# Patient Record
Sex: Female | Born: 1970 | Race: White | Hispanic: No | Marital: Married
Health system: Midwestern US, Community
[De-identification: ages and names within clinical notes are randomized; demographics above are authoritative.]

## PROBLEM LIST (undated history)

## (undated) DIAGNOSIS — M1990 Unspecified osteoarthritis, unspecified site: Secondary | ICD-10-CM

## (undated) DIAGNOSIS — J449 Chronic obstructive pulmonary disease, unspecified: Secondary | ICD-10-CM

## (undated) DIAGNOSIS — R51 Headache: Secondary | ICD-10-CM

## (undated) DIAGNOSIS — F329 Major depressive disorder, single episode, unspecified: Secondary | ICD-10-CM

## (undated) DIAGNOSIS — J069 Acute upper respiratory infection, unspecified: Secondary | ICD-10-CM

## (undated) DIAGNOSIS — K219 Gastro-esophageal reflux disease without esophagitis: Secondary | ICD-10-CM

---
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
## 1996-10-26 HISTORY — PX: TUBAL LIGATION: SHX77

## 1997-10-26 DIAGNOSIS — F32A Depression, unspecified: Secondary | ICD-10-CM

## 1997-10-26 HISTORY — DX: Depression, unspecified: F32.A

## 1999-10-27 HISTORY — PX: ABDOMINAL HYSTERECTOMY: SHX81

## 2009-10-26 DIAGNOSIS — R51 Headache: Secondary | ICD-10-CM

## 2009-10-26 DIAGNOSIS — J449 Chronic obstructive pulmonary disease, unspecified: Secondary | ICD-10-CM

## 2009-10-26 HISTORY — DX: Chronic obstructive pulmonary disease, unspecified: J44.9

## 2009-10-26 HISTORY — DX: Headache: R51

## 2012-01-21 ENCOUNTER — Other Ambulatory Visit: Payer: Self-pay | Admitting: Neurosurgery

## 2012-02-04 ENCOUNTER — Inpatient Hospital Stay (HOSPITAL_COMMUNITY): Admission: RE | Admit: 2012-02-04 | Discharge: 2012-02-04 | Payer: Self-pay | Source: Ambulatory Visit

## 2012-02-04 NOTE — Pre-Procedure Instructions (Signed)
20 Sabena M Kaiser  02/04/2012   Your procedure is scheduled on:  Fri, April 19 @ 11:10 am   Report to [facility] at 0800 AM.  Call this number if you have problems the morning of surgery: [phone]   Remember:   Do not eat food:After Midnight.  May have clear liquids: up to 4 Hours before arrival.(until 4:00 am)  Clear liquids include soda, tea, black coffee, apple or grape juice, broth,water  Take these medicines the morning of surgery with A SIP OF WATER:    Do not wear jewelry, make-up or nail polish.  Do not wear lotions, powders, or perfumes.   Do not shave 48 hours prior to surgery.  Do not bring valuables to the hospital.  Contacts, dentures or bridgework may not be worn into surgery.  Leave suitcase in the car. After surgery it may be brought to your room.  For patients admitted to the hospital, checkout time is 11:00 AM the day of discharge.   Special Instructions: CHG Shower Use Special Wash: 1/2 bottle night before surgery and 1/2 bottle morning of surgery.   Please read over the following fact sheets that you were given: Pain Booklet, Coughing and Deep Breathing, MRSA Information and Surgical Site Infection Prevention

## 2012-02-12 ENCOUNTER — Ambulatory Visit (HOSPITAL_COMMUNITY): Admission: RE | Admit: 2012-02-12 | Payer: Medicare Other | Source: Ambulatory Visit | Admitting: Neurosurgery

## 2012-02-12 ENCOUNTER — Encounter (HOSPITAL_COMMUNITY): Admission: RE | Payer: Self-pay | Source: Ambulatory Visit

## 2012-02-12 SURGERY — ANTERIOR CERVICAL DECOMPRESSION/DISCECTOMY FUSION 2 LEVELS
Anesthesia: General

## 2012-03-01 ENCOUNTER — Other Ambulatory Visit: Payer: Self-pay | Admitting: Neurosurgery

## 2012-03-24 ENCOUNTER — Encounter (HOSPITAL_COMMUNITY): Payer: Self-pay

## 2012-03-24 ENCOUNTER — Inpatient Hospital Stay (HOSPITAL_COMMUNITY): Admission: RE | Admit: 2012-03-24 | Discharge: 2012-03-24 | Payer: Medicare Other | Source: Ambulatory Visit

## 2012-03-24 HISTORY — DX: Major depressive disorder, single episode, unspecified: F32.9

## 2012-03-24 HISTORY — DX: Gastro-esophageal reflux disease without esophagitis: K21.9

## 2012-03-24 HISTORY — DX: Headache: R51

## 2012-03-24 HISTORY — DX: Chronic obstructive pulmonary disease, unspecified: J44.9

## 2012-03-24 HISTORY — DX: Unspecified osteoarthritis, unspecified site: M19.90

## 2012-03-24 NOTE — Progress Notes (Signed)
Pt called to obtain PAT assessment. Denies sleep apnea-stated she was tested over 10years ago & it was negative.  Denies any cardiac studies.  She reports having congestion at this time and plans on seeing PCP: Dr. Vanetta Shawl w/[facility](#[phone]) tomorrow.  Encouraged pt to follow through especially due to having surgery in over 1 week and maybe being able to treat prior to surgery.  Pt verbalized understanding.  Confirmed w/ pt date and time of lab appt.//L. Ayaka Andes,RN

## 2012-03-24 NOTE — Progress Notes (Signed)
Noted that orders written on 03/01/2012- will be out of date by time of surgery on 04/08/2012.  Attempted to notify Jessica Hanks at Dr. Andre Lefort was out to lunch.  Message left w/ receptionist, Kaysha.//L. Giorgi Debruin,RN

## 2012-03-25 ENCOUNTER — Encounter (HOSPITAL_COMMUNITY)
Admission: RE | Admit: 2012-03-25 | Discharge: 2012-03-25 | Disposition: A | Discharge status: Home / Self Care | Payer: Medicare Other | Source: Ambulatory Visit | Attending: Neurosurgery | Admitting: Neurosurgery

## 2012-03-25 ENCOUNTER — Encounter (HOSPITAL_COMMUNITY): Payer: Self-pay

## 2012-03-25 ENCOUNTER — Encounter (HOSPITAL_COMMUNITY): Payer: Self-pay | Admitting: Pharmacy Technician

## 2012-03-25 HISTORY — DX: Acute upper respiratory infection, unspecified: J06.9

## 2012-03-25 LAB — CBC
HCT: 44.6 % (ref 36.0–46.0)
Hemoglobin: 15 g/dL (ref 12.0–15.0)
WBC: 12.4 10*3/uL — ABNORMAL HIGH (ref 4.0–10.5)

## 2012-03-25 LAB — SURGICAL PCR SCREEN: MRSA, PCR: NEGATIVE

## 2012-03-25 NOTE — Pre-Procedure Instructions (Addendum)
20 Alicia Yang   03/25/2012   Your procedure is scheduled on:  June 14th, Friday  Report to [facility] at 800 AM.   Call this number if you have problems the morning of surgery: [phone]   Remember:   Do not eat food:After Midnight Thursday.  May have clear liquids: up to 4 Hours before arrival time-400 AM.  Clear liquids include soda, tea, black coffee, apple or grape juice, broth.   Take these medicines the morning of surgery with A SIP OF WATER:  Albuterol,              Xanax, Wellbutrin, Celexa, Percocet, Zantac,nexium, singulair                           Do NOT take any aspirin, anti-inflammatories, coumadin, plavix or herbal             Medications/ supplements x 4-5 days prior to   Do not wear jewelry, make-up or nail polish.  Do not wear lotions, powders, or perfumes. You may wear deodorant.  Do not shave 48 hours prior to surgery. Men may shave face and neck.   Do not bring valuables to the hospital.   Contacts, dentures or bridgework may not be worn into surgery.  Leave suitcase in the car. After surgery it may be brought to your room.  For patients admitted to the hospital, checkout time is 11:00 AM the day of discharge.   Patients discharged the day of surgery will not be allowed to drive home.  Name and phone number of your driver: brittany [phone]  Special Instructions: CHG Shower Use Special Wash: 1/2 bottle night before surgery and 1/2 bottle morning of surgery.   Please read over the following fact sheets that you were given: Pain Booklet, Coughing and Deep Breathing, MRSA Information and Surgical Site Infection Prevention

## 2012-03-28 ENCOUNTER — Other Ambulatory Visit: Payer: Self-pay | Admitting: Neurosurgery

## 2012-03-28 NOTE — Consult Note (Signed)
Anesthesia Chart Review:  Patient is a 41 year old female scheduled for C4-5, C5-6 ACDF with fusion on 04/08/12. History includes smoking, COPD, asthma, GERD, arthritis, headaches, depression, obesity with BMI 37.56, and abdominal hysterectomy.   PCP is documented as Dr. Meredith Mody [phone]) with [facility].  Labs noted.  "No worrisome focal or acute cardiopulmonary abnormalities seen" on CXR from 03/25/12.  EKG from 03/25/12 showed NSR, poor anterior r wave progression--cannot rule out anterior infarct (age undetermined).  Currently there are no comparison EKGs available (within [facility] or at PCP office).  No CV symptoms were documented at her PAT appointment.  She has no known history of HTN, CAD/MI/CHF, or DM.  If remains asymptomatic, then anticipate she can proceed as planned.  Shonna Chock, PA-C

## 2012-04-07 MED ORDER — CEFAZOLIN SODIUM-DEXTROSE 2-3 GM-% IV SOLR: 2.0000 g | INTRAVENOUS | Status: DC

## 2012-04-08 ENCOUNTER — Ambulatory Visit (HOSPITAL_COMMUNITY): Payer: Medicare Other

## 2012-04-08 ENCOUNTER — Ambulatory Visit (HOSPITAL_COMMUNITY): Payer: Medicare Other | Admitting: Vascular Surgery

## 2012-04-08 ENCOUNTER — Inpatient Hospital Stay (HOSPITAL_COMMUNITY)
Admission: RE | Admit: 2012-04-08 | Discharge: 2012-04-09 | DRG: 473 | Disposition: A | Discharge status: Home / Self Care | Payer: Medicare Other | Source: Ambulatory Visit | Attending: Neurosurgery | Admitting: Neurosurgery

## 2012-04-08 ENCOUNTER — Encounter (HOSPITAL_COMMUNITY): Payer: Self-pay

## 2012-04-08 ENCOUNTER — Encounter (HOSPITAL_COMMUNITY): Payer: Self-pay | Admitting: Vascular Surgery

## 2012-04-08 ENCOUNTER — Encounter (HOSPITAL_COMMUNITY)
Admission: RE | Disposition: A | Discharge status: Home / Self Care | Payer: Self-pay | Source: Ambulatory Visit | Attending: Neurosurgery

## 2012-04-08 DIAGNOSIS — Q762 Congenital spondylolisthesis: Secondary | ICD-10-CM

## 2012-04-08 DIAGNOSIS — F411 Generalized anxiety disorder: Secondary | ICD-10-CM | POA: Diagnosis present

## 2012-04-08 DIAGNOSIS — K219 Gastro-esophageal reflux disease without esophagitis: Secondary | ICD-10-CM | POA: Diagnosis present

## 2012-04-08 DIAGNOSIS — Z6832 Body mass index (BMI) 32.0-32.9, adult: Secondary | ICD-10-CM

## 2012-04-08 DIAGNOSIS — F329 Major depressive disorder, single episode, unspecified: Secondary | ICD-10-CM | POA: Diagnosis present

## 2012-04-08 DIAGNOSIS — F4310 Post-traumatic stress disorder, unspecified: Secondary | ICD-10-CM | POA: Diagnosis present

## 2012-04-08 DIAGNOSIS — Z79899 Other long term (current) drug therapy: Secondary | ICD-10-CM

## 2012-04-08 DIAGNOSIS — M47812 Spondylosis without myelopathy or radiculopathy, cervical region: Secondary | ICD-10-CM | POA: Diagnosis present

## 2012-04-08 DIAGNOSIS — J449 Chronic obstructive pulmonary disease, unspecified: Secondary | ICD-10-CM | POA: Diagnosis present

## 2012-04-08 DIAGNOSIS — J4489 Other specified chronic obstructive pulmonary disease: Secondary | ICD-10-CM | POA: Diagnosis present

## 2012-04-08 DIAGNOSIS — F1210 Cannabis abuse, uncomplicated: Secondary | ICD-10-CM | POA: Diagnosis present

## 2012-04-08 DIAGNOSIS — F3289 Other specified depressive episodes: Secondary | ICD-10-CM | POA: Diagnosis present

## 2012-04-08 DIAGNOSIS — F17200 Nicotine dependence, unspecified, uncomplicated: Secondary | ICD-10-CM | POA: Diagnosis present

## 2012-04-08 DIAGNOSIS — Z833 Family history of diabetes mellitus: Secondary | ICD-10-CM

## 2012-04-08 DIAGNOSIS — M5020 Other cervical disc displacement, unspecified cervical region: Principal | ICD-10-CM | POA: Diagnosis present

## 2012-04-08 HISTORY — PX: ANTERIOR CERVICAL DECOMP/DISCECTOMY FUSION: SHX1161

## 2012-04-08 SURGERY — ANTERIOR CERVICAL DECOMPRESSION/DISCECTOMY FUSION 2 LEVELS
Anesthesia: General | Site: Neck | Laterality: Bilateral | Wound class: Clean

## 2012-04-08 MED ORDER — MORPHINE SULFATE 2 MG/ML IJ SOLN
1.0000 mg | INTRAMUSCULAR | Status: DC | PRN
  Administered 2012-04-08 (×2): 2 mg via INTRAVENOUS
  Filled 2012-04-08: qty 2

## 2012-04-08 MED ORDER — HYDROMORPHONE HCL PF 1 MG/ML IJ SOLN
INTRAMUSCULAR | Status: AC
  Filled 2012-04-08: qty 1

## 2012-04-08 MED ORDER — FAMOTIDINE 10 MG PO TABS
10.0000 mg | ORAL_TABLET | Freq: Every day | ORAL | Status: DC
  Filled 2012-04-08: qty 1

## 2012-04-08 MED ORDER — MENTHOL 3 MG MT LOZG
1.0000 | LOZENGE | OROMUCOSAL | Status: DC | PRN
  Filled 2012-04-08: qty 9

## 2012-04-08 MED ORDER — BUPROPION HCL 100 MG PO TABS
100.0000 mg | ORAL_TABLET | Freq: Two times a day (BID) | ORAL | Status: DC
  Administered 2012-04-08: 100 mg via ORAL
  Filled 2012-04-08 (×3): qty 1

## 2012-04-08 MED ORDER — LACTATED RINGERS IV SOLN
INTRAVENOUS | Status: DC | PRN
  Administered 2012-04-08 (×2): via INTRAVENOUS

## 2012-04-08 MED ORDER — SODIUM CHLORIDE 0.9 % IJ SOLN
3.0000 mL | Freq: Two times a day (BID) | INTRAMUSCULAR | Status: DC
  Administered 2012-04-08: 3 mL via INTRAVENOUS

## 2012-04-08 MED ORDER — EPHEDRINE SULFATE 50 MG/ML IJ SOLN
INTRAMUSCULAR | Status: DC | PRN
  Administered 2012-04-08: 10 mg via INTRAVENOUS

## 2012-04-08 MED ORDER — PANTOPRAZOLE SODIUM 40 MG PO TBEC: 40.0000 mg | DELAYED_RELEASE_TABLET | Freq: Every day | ORAL | Status: DC

## 2012-04-08 MED ORDER — HEMOSTATIC AGENTS (NO CHARGE) OPTIME
TOPICAL | Status: DC | PRN
  Administered 2012-04-08: 1 via TOPICAL

## 2012-04-08 MED ORDER — LIDOCAINE HCL (CARDIAC) 20 MG/ML IV SOLN
INTRAVENOUS | Status: DC | PRN
  Administered 2012-04-08: 40 mg via INTRAVENOUS
  Administered 2012-04-08: 100 mg via INTRAVENOUS

## 2012-04-08 MED ORDER — ACETAMINOPHEN 650 MG RE SUPP: 650.0000 mg | RECTAL | Status: DC | PRN

## 2012-04-08 MED ORDER — CALCIUM-VITAMIN D 500-200 MG-UNIT PO TABS: 1.0000 | ORAL_TABLET | Freq: Two times a day (BID) | ORAL | Status: DC

## 2012-04-08 MED ORDER — ONDANSETRON HCL 4 MG/2ML IJ SOLN: 4.0000 mg | Freq: Once | INTRAMUSCULAR | Status: DC | PRN

## 2012-04-08 MED ORDER — ONDANSETRON HCL 4 MG/2ML IJ SOLN
4.0000 mg | INTRAMUSCULAR | Status: DC | PRN
  Administered 2012-04-08: 4 mg via INTRAVENOUS
  Filled 2012-04-08: qty 2

## 2012-04-08 MED ORDER — HYDROCODONE-ACETAMINOPHEN 5-325 MG PO TABS: 1.0000 | ORAL_TABLET | ORAL | Status: DC | PRN

## 2012-04-08 MED ORDER — ALBUTEROL SULFATE HFA 108 (90 BASE) MCG/ACT IN AERS
INHALATION_SPRAY | RESPIRATORY_TRACT | Status: DC | PRN
  Administered 2012-04-08: 2 via RESPIRATORY_TRACT

## 2012-04-08 MED ORDER — CEFAZOLIN SODIUM 1-5 GM-% IV SOLN
1.0000 g | Freq: Three times a day (TID) | INTRAVENOUS | Status: AC
  Administered 2012-04-08 – 2012-04-09 (×2): 1 g via INTRAVENOUS
  Filled 2012-04-08 (×2): qty 50

## 2012-04-08 MED ORDER — ALPRAZOLAM 0.5 MG PO TABS
1.0000 mg | ORAL_TABLET | Freq: Three times a day (TID) | ORAL | Status: DC | PRN
  Administered 2012-04-08: 1 mg via ORAL
  Filled 2012-04-08: qty 2

## 2012-04-08 MED ORDER — THROMBIN 5000 UNITS EX KIT
PACK | CUTANEOUS | Status: DC | PRN
  Administered 2012-04-08 (×2): 5000 [IU] via TOPICAL

## 2012-04-08 MED ORDER — OXYCODONE-ACETAMINOPHEN 5-325 MG PO TABS
ORAL_TABLET | ORAL | Status: AC
  Filled 2012-04-08: qty 2

## 2012-04-08 MED ORDER — CARISOPRODOL 350 MG PO TABS: 350.0000 mg | ORAL_TABLET | Freq: Three times a day (TID) | ORAL | Status: DC | PRN

## 2012-04-08 MED ORDER — 0.9 % SODIUM CHLORIDE (POUR BTL) OPTIME
TOPICAL | Status: DC | PRN
  Administered 2012-04-08: 1000 mL

## 2012-04-08 MED ORDER — ONDANSETRON HCL 4 MG/2ML IJ SOLN
INTRAMUSCULAR | Status: DC | PRN
  Administered 2012-04-08: 4 mg via INTRAVENOUS

## 2012-04-08 MED ORDER — GLYCOPYRROLATE 0.2 MG/ML IJ SOLN
INTRAMUSCULAR | Status: DC | PRN
  Administered 2012-04-08: .7 mg via INTRAVENOUS

## 2012-04-08 MED ORDER — SODIUM CHLORIDE 0.9 % IV SOLN
INTRAVENOUS | Status: AC
  Filled 2012-04-08: qty 500

## 2012-04-08 MED ORDER — ALBUTEROL SULFATE HFA 108 (90 BASE) MCG/ACT IN AERS
2.0000 | INHALATION_SPRAY | Freq: Four times a day (QID) | RESPIRATORY_TRACT | Status: DC | PRN
  Filled 2012-04-08: qty 6.7

## 2012-04-08 MED ORDER — FENTANYL CITRATE 0.05 MG/ML IJ SOLN
INTRAMUSCULAR | Status: DC | PRN
  Administered 2012-04-08: 50 ug via INTRAVENOUS
  Administered 2012-04-08 (×2): 100 ug via INTRAVENOUS
  Administered 2012-04-08 (×5): 50 ug via INTRAVENOUS

## 2012-04-08 MED ORDER — NEOSTIGMINE METHYLSULFATE 1 MG/ML IJ SOLN
INTRAMUSCULAR | Status: DC | PRN
  Administered 2012-04-08: 5 mg via INTRAVENOUS

## 2012-04-08 MED ORDER — CITALOPRAM HYDROBROMIDE 40 MG PO TABS
40.0000 mg | ORAL_TABLET | Freq: Every day | ORAL | Status: DC
  Filled 2012-04-08: qty 1

## 2012-04-08 MED ORDER — SODIUM CHLORIDE 0.9 % IJ SOLN: 3.0000 mL | INTRAMUSCULAR | Status: DC | PRN

## 2012-04-08 MED ORDER — LIDOCAINE-EPINEPHRINE 1 %-1:100000 IJ SOLN
INTRAMUSCULAR | Status: DC | PRN
  Administered 2012-04-08: 2.5 mL

## 2012-04-08 MED ORDER — HYDROMORPHONE HCL PF 1 MG/ML IJ SOLN: 0.5000 mg | INTRAMUSCULAR | Status: DC | PRN

## 2012-04-08 MED ORDER — DOCUSATE SODIUM 100 MG PO CAPS
100.0000 mg | ORAL_CAPSULE | Freq: Two times a day (BID) | ORAL | Status: DC
  Administered 2012-04-08: 100 mg via ORAL
  Filled 2012-04-08: qty 1

## 2012-04-08 MED ORDER — DEXAMETHASONE SODIUM PHOSPHATE 4 MG/ML IJ SOLN
INTRAMUSCULAR | Status: DC | PRN
  Administered 2012-04-08: 10 mg via INTRAVENOUS

## 2012-04-08 MED ORDER — KCL IN DEXTROSE-NACL 20-5-0.45 MEQ/L-%-% IV SOLN
INTRAVENOUS | Status: DC
  Administered 2012-04-08: 20:00:00 via INTRAVENOUS
  Filled 2012-04-08 (×3): qty 1000

## 2012-04-08 MED ORDER — OXYCODONE-ACETAMINOPHEN 10-325 MG PO TABS: 1.0000 | ORAL_TABLET | ORAL | Status: DC | PRN

## 2012-04-08 MED ORDER — CALCIUM CARBONATE-VITAMIN D 500-200 MG-UNIT PO TABS
1.0000 | ORAL_TABLET | Freq: Two times a day (BID) | ORAL | Status: DC
  Administered 2012-04-09: 1 via ORAL
  Filled 2012-04-08 (×3): qty 1

## 2012-04-08 MED ORDER — DIAZEPAM 5 MG PO TABS
ORAL_TABLET | ORAL | Status: AC
  Filled 2012-04-08: qty 1

## 2012-04-08 MED ORDER — ZOLPIDEM TARTRATE 5 MG PO TABS: 10.0000 mg | ORAL_TABLET | Freq: Every evening | ORAL | Status: DC | PRN

## 2012-04-08 MED ORDER — HYDROMORPHONE HCL PF 1 MG/ML IJ SOLN
0.2500 mg | INTRAMUSCULAR | Status: DC | PRN
  Administered 2012-04-08 (×6): 0.5 mg via INTRAVENOUS

## 2012-04-08 MED ORDER — ACETAMINOPHEN 325 MG PO TABS: 650.0000 mg | ORAL_TABLET | ORAL | Status: DC | PRN

## 2012-04-08 MED ORDER — PROPOFOL 10 MG/ML IV BOLUS
INTRAVENOUS | Status: DC | PRN
  Administered 2012-04-08: 200 mg via INTRAVENOUS

## 2012-04-08 MED ORDER — OXYCODONE-ACETAMINOPHEN 5-325 MG PO TABS
1.0000 | ORAL_TABLET | ORAL | Status: DC | PRN
  Administered 2012-04-08 – 2012-04-09 (×3): 2 via ORAL
  Filled 2012-04-08 (×3): qty 2

## 2012-04-08 MED ORDER — SODIUM CHLORIDE 0.9 % IR SOLN
Status: DC | PRN
  Administered 2012-04-08: 14:00:00

## 2012-04-08 MED ORDER — VITAMIN B-12 100 MCG PO TABS: 50.0000 ug | ORAL_TABLET | ORAL | Status: DC

## 2012-04-08 MED ORDER — BACITRACIN 50000 UNITS IM SOLR
INTRAMUSCULAR | Status: AC
  Filled 2012-04-08: qty 1

## 2012-04-08 MED ORDER — DIAZEPAM 5 MG PO TABS
5.0000 mg | ORAL_TABLET | Freq: Four times a day (QID) | ORAL | Status: DC | PRN
  Administered 2012-04-08 – 2012-04-09 (×3): 5 mg via ORAL
  Filled 2012-04-08 (×2): qty 1

## 2012-04-08 MED ORDER — PHENOL 1.4 % MT LIQD
1.0000 | OROMUCOSAL | Status: DC | PRN
  Administered 2012-04-09: 1 via OROMUCOSAL

## 2012-04-08 MED ORDER — BUPIVACAINE HCL (PF) 0.5 % IJ SOLN
INTRAMUSCULAR | Status: DC | PRN
  Administered 2012-04-08: 2.5 mL

## 2012-04-08 MED ORDER — MONTELUKAST SODIUM 10 MG PO TABS
10.0000 mg | ORAL_TABLET | Freq: Every day | ORAL | Status: DC
  Filled 2012-04-08: qty 1

## 2012-04-08 MED ORDER — OXYCODONE-ACETAMINOPHEN 5-325 MG PO TABS
1.0000 | ORAL_TABLET | ORAL | Status: DC | PRN
  Administered 2012-04-08: 2 via ORAL

## 2012-04-08 MED ORDER — CEFAZOLIN SODIUM-DEXTROSE 2-3 GM-% IV SOLR
INTRAVENOUS | Status: AC
  Administered 2012-04-08: 2 g via INTRAVENOUS
  Filled 2012-04-08: qty 50

## 2012-04-08 MED ORDER — ROCURONIUM BROMIDE 100 MG/10ML IV SOLN
INTRAVENOUS | Status: DC | PRN
  Administered 2012-04-08: 50 mg via INTRAVENOUS

## 2012-04-08 MED ORDER — MIDAZOLAM HCL 5 MG/5ML IJ SOLN
INTRAMUSCULAR | Status: DC | PRN
  Administered 2012-04-08: 2 mg via INTRAVENOUS

## 2012-04-08 SURGICAL SUPPLY — 66 items
BAG DECANTER FOR FLEXI CONT (MISCELLANEOUS) ×2 IMPLANT
BANDAGE GAUZE ELAST BULKY 4 IN (GAUZE/BANDAGES/DRESSINGS) IMPLANT
BENZOIN TINCTURE PRP APPL 2/3 (GAUZE/BANDAGES/DRESSINGS) IMPLANT
BIT DRILL 2.3 12 FIXED (INSTRUMENTS) ×1 IMPLANT
BIT DRILL NEURO 2X3.1 SFT TUCH (MISCELLANEOUS) ×1 IMPLANT
BLADE ULTRA TIP 2M (BLADE) IMPLANT
BONE CERV LORDOTIC 14.5X12X6 (Bone Implant) ×4 IMPLANT
BUR BARREL STRAIGHT FLUTE 4.0 (BURR) ×2 IMPLANT
CANISTER SUCTION 2500CC (MISCELLANEOUS) ×2 IMPLANT
CLOTH BEACON ORANGE TIMEOUT ST (SAFETY) ×2 IMPLANT
CONT SPEC 4OZ CLIKSEAL STRL BL (MISCELLANEOUS) ×2 IMPLANT
COVER MAYO STAND STRL (DRAPES) ×2 IMPLANT
DERMABOND ADVANCED (GAUZE/BANDAGES/DRESSINGS) ×1
DERMABOND ADVANCED .7 DNX12 (GAUZE/BANDAGES/DRESSINGS) ×1 IMPLANT
DRAPE LAPAROTOMY 100X72 PEDS (DRAPES) ×2 IMPLANT
DRAPE MICROSCOPE LEICA (MISCELLANEOUS) IMPLANT
DRAPE POUCH INSTRU U-SHP 10X18 (DRAPES) ×2 IMPLANT
DRAPE PROXIMA HALF (DRAPES) IMPLANT
DRESSING TELFA 8X3 (GAUZE/BANDAGES/DRESSINGS) IMPLANT
DRILL 12MM (INSTRUMENTS) ×2
DRILL NEURO 2X3.1 SOFT TOUCH (MISCELLANEOUS) ×2
DURAPREP 6ML APPLICATOR 50/CS (WOUND CARE) ×2 IMPLANT
ELECT COATED BLADE 2.86 ST (ELECTRODE) ×2 IMPLANT
ELECT REM PT RETURN 9FT ADLT (ELECTROSURGICAL) ×2
ELECTRODE REM PT RTRN 9FT ADLT (ELECTROSURGICAL) ×1 IMPLANT
GAUZE SPONGE 4X4 16PLY XRAY LF (GAUZE/BANDAGES/DRESSINGS) IMPLANT
GLOVE BIO SURGEON STRL SZ8 (GLOVE) IMPLANT
GLOVE BIOGEL PI IND STRL 8 (GLOVE) ×1 IMPLANT
GLOVE BIOGEL PI IND STRL 8.5 (GLOVE) ×2 IMPLANT
GLOVE BIOGEL PI INDICATOR 8 (GLOVE) ×1
GLOVE BIOGEL PI INDICATOR 8.5 (GLOVE) ×2
GLOVE ECLIPSE 8.0 STRL XLNG CF (GLOVE) IMPLANT
GLOVE EXAM NITRILE LRG STRL (GLOVE) IMPLANT
GLOVE EXAM NITRILE MD LF STRL (GLOVE) IMPLANT
GLOVE EXAM NITRILE XL STR (GLOVE) IMPLANT
GLOVE EXAM NITRILE XS STR PU (GLOVE) IMPLANT
GLOVE SURG SS PI 8.0 STRL IVOR (GLOVE) ×8 IMPLANT
GOWN BRE IMP SLV AUR LG STRL (GOWN DISPOSABLE) IMPLANT
GOWN BRE IMP SLV AUR XL STRL (GOWN DISPOSABLE) ×6 IMPLANT
GOWN STRL REIN 2XL LVL4 (GOWN DISPOSABLE) ×4 IMPLANT
HEAD HALTER (SOFTGOODS) ×2 IMPLANT
KIT BASIN OR (CUSTOM PROCEDURE TRAY) ×2 IMPLANT
KIT ROOM TURNOVER OR (KITS) ×2 IMPLANT
NEEDLE HYPO 18GX1.5 BLUNT FILL (NEEDLE) ×2 IMPLANT
NEEDLE HYPO 25X1 1.5 SAFETY (NEEDLE) ×2 IMPLANT
NEEDLE SPNL 22GX3.5 QUINCKE BK (NEEDLE) ×4 IMPLANT
NS IRRIG 1000ML POUR BTL (IV SOLUTION) ×2 IMPLANT
PACK LAMINECTOMY NEURO (CUSTOM PROCEDURE TRAY) ×2 IMPLANT
PAD ARMBOARD 7.5X6 YLW CONV (MISCELLANEOUS) ×2 IMPLANT
PIN DISTRACTION 14MM (PIN) ×4 IMPLANT
PLATE 30MM (Plate) ×2 IMPLANT
RUBBERBAND STERILE (MISCELLANEOUS) IMPLANT
SCREW 12MM (Screw) ×6 IMPLANT
SCREW BN 12X4.5XST VA (Screw) ×6 IMPLANT
SPONGE GAUZE 4X4 12PLY (GAUZE/BANDAGES/DRESSINGS) IMPLANT
SPONGE INTESTINAL PEANUT (DISPOSABLE) ×4 IMPLANT
SPONGE SURGIFOAM ABS GEL SZ50 (HEMOSTASIS) IMPLANT
STAPLER SKIN PROX WIDE 3.9 (STAPLE) IMPLANT
STRIP CLOSURE SKIN 1/2X4 (GAUZE/BANDAGES/DRESSINGS) IMPLANT
SUT VIC AB 3-0 SH 8-18 (SUTURE) ×2 IMPLANT
SYR 20ML ECCENTRIC (SYRINGE) ×2 IMPLANT
SYR 3ML LL SCALE MARK (SYRINGE) ×2 IMPLANT
TOWEL OR 17X24 6PK STRL BLUE (TOWEL DISPOSABLE) ×2 IMPLANT
TOWEL OR 17X26 10 PK STRL BLUE (TOWEL DISPOSABLE) ×2 IMPLANT
TRAP SPECIMEN MUCOUS 40CC (MISCELLANEOUS) IMPLANT
WATER STERILE IRR 1000ML POUR (IV SOLUTION) ×2 IMPLANT

## 2012-04-08 NOTE — Plan of Care (Signed)
Problem: Consults Goal: Diagnosis - Spinal Surgery Outcome: Completed/Met Date Met:  04/08/12 Cervical Spine Fusion     

## 2012-04-08 NOTE — Preoperative (Signed)
Beta Blockers   Reason not to administer Beta Blockers:Not Applicable 

## 2012-04-08 NOTE — Anesthesia Procedure Notes (Signed)
Procedure Name: Intubation Date/Time: 04/08/2012 1:53 PM Performed by: Rogelia Boga Pre-anesthesia Checklist: Patient identified, Emergency Drugs available, Suction available, Patient being monitored and Timeout performed Patient Re-evaluated:Patient Re-evaluated prior to inductionOxygen Delivery Method: Circle system utilized Preoxygenation: Pre-oxygenation with 100% oxygen Intubation Type: IV induction Ventilation: Mask ventilation without difficulty and Oral airway inserted - appropriate to patient size Laryngoscope Size: Mac and 4 Grade View: Grade II Tube type: Oral Tube size: 7.5 mm Number of attempts: 1 Airway Equipment and Method: Stylet Placement Confirmation: ETT inserted through vocal cords under direct vision,  positive ETCO2 and breath sounds checked- equal and bilateral Secured at: 20 cm Tube secured with: Tape Dental Injury: Teeth and Oropharynx as per pre-operative assessment

## 2012-04-08 NOTE — Interval H&P Note (Signed)
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 After consideration of risks, benefits and other options for treatment, the patient has consented to  Procedure(s) (LRB): ANTERIOR CERVICAL DECOMPRESSION/DISCECTOMY FUSION 2 LEVELS (N/A) as a surgical intervention .  The patients' history has been reviewed, patient examined, no change in status, stable for surgery.  I have reviewed the patients' chart and labs.  Questions were answered to the patient's satisfaction.     Alicia Yang  Date of Initial H&P: 03/09/2012  History reviewed, patient examined, no change in status, stable for surgery.

## 2012-04-08 NOTE — Progress Notes (Signed)
Patient awake, alert, conversant.  MAEW with full bilateral upper extremity strength in D/B/T/HI.  Doing well.

## 2012-04-08 NOTE — Anesthesia Postprocedure Evaluation (Signed)
  Anesthesia Post-op Note  Patient: Alicia Yang  Procedure(s) Performed: Procedure(s) (LRB): ANTERIOR CERVICAL DECOMPRESSION/DISCECTOMY FUSION 2 LEVELS (Bilateral)  Patient Location: PACU  Anesthesia Type: General  Level of Consciousness: awake, alert  and oriented  Airway and Oxygen Therapy: Patient Spontanous Breathing and Patient connected to nasal cannula oxygen  Post-op Pain: moderate  Post-op Assessment: Post-op Vital signs reviewed  Post-op Vital Signs: Reviewed  Complications: No apparent anesthesia complications

## 2012-04-08 NOTE — Op Note (Signed)
04/08/2012  4:31 PM  PATIENT:  Alicia Yang  41 y.o. female  PRE-OPERATIVE DIAGNOSIS:  Cervical herniated nucleus pulposus without myelopathy, Cervical spondylosis, Cervical stenosis, Cervical radiculopathy C45 and C56  POST-OPERATIVE DIAGNOSIS:  Cervical herniated nucleus pulposus without myelopathy, Cervical spondylosis, Cervical stenosis, Cervical radiculopathy C45 and C56  PROCEDURE:  Procedure(s) (LRB): ANTERIOR CERVICAL DECOMPRESSION/DISCECTOMY FUSION 2 LEVELS with Autograft, allograft, plate(Bilateral)  SURGEON:  Surgeon(s) and Role:    * Maeola Harman, MD - Primary    * Mariam Dollar, MD - Assisting  PHYSICIAN ASSISTANT:   ASSISTANTS: Poteat, RN   ANESTHESIA:   general  EBL:  Total I/O In: 2000 [I.V.:2000] Out: -   BLOOD ADMINISTERED:none  DRAINS: none   LOCAL MEDICATIONS USED:  LIDOCAINE   SPECIMEN:  No Specimen  DISPOSITION OF SPECIMEN:  N/A  COUNTS:  YES  TOURNIQUET:  * No tourniquets in log *  DICTATION: Patient is 41 year old female with left arm pain and weakness with HNP, spondylosis, radiculopathy C45 and C56  PROCEDURE: Patient was brought to operating room and following the smooth and uncomplicated induction of general endotracheal anesthesia her head was placed on a horseshoe head holder she was placed in 5 pounds of Holter traction and her anterior neck was prepped and draped in usual sterile fashion. An incision was made on the left side of midline after infiltrating the skin and subcutaneous tissues with local lidocaine. The platysmal layer was incised and subplatysmal dissection was performed exposing the anterior border sternocleidomastoid muscle. Using blunt dissection the carotid sheath was kept lateral and trachea and esophagus kept medial exposing the anterior cervical spine. A bent spinal needle was placed it was felt to be the C45 and C56 levels and this was confirmed on a second intraoperative x-ray. Longus coli muscles were taken down from the  anterior cervical spine using electrocautery and key elevator and self-retaining retractor was placed exposing the C45 and C56 levels. The interspaces were incised and a thorough discectomy was performed. Distraction pins were placed. Initially the C45 level was operated. Uncinate spurs and central spondylitic ridges were drilled down with a high-speed drill. The spinal cord dura and both C5 nerve roots were widely decompressed. Hemostasis was assured. After trial sizing an 6x4 mm allograft bone wedge was selected and packed with local autograft. This was tamped into position and countersunk appropriately. Attention was the paid to the C5/6 level, where similar decompression was performed.  Uncinate spurs and central spondylitic ridges were drilled down with a high-speed drill. The spinal cord dura and both C6 nerve roots were widely decompressed. Hemostasis was assured. After trial sizing a similarly sized graft was selected and packed with autograft. This was tamped into position and countersunk appropriately.Distraction weight was removed. A 30 mm trestle luxe anterior cervical plate was affixed to the cervical spine with 12 mm variable-angle screws 2 at C4, 2 at C5 and 2 at C6. All screws were well-positioned and locking mechanisms were engaged. A final X ray was obtained which did not show any of the implanted hardware because of the patient's large body habitus.. Soft tissues were inspected and found to be in good repair. The wound was irrigated. The platysma layer was closed with 3-0 Vicryl stitches and the skin was reapproximated with 3-0 Vicryl subcuticular stitches. The wound was dressed with Dermabond. Counts were correct at the end of the case. Patient was extubated and taken to recovery in stable and satisfactory condition.    PLAN OF CARE: Admit  for overnight observation  PATIENT DISPOSITION:  PACU - hemodynamically stable.   Delay start of Pharmacological VTE agent (>24hrs) due to surgical  blood loss or risk of bleeding: yes

## 2012-04-08 NOTE — Anesthesia Preprocedure Evaluation (Addendum)
Anesthesia Evaluation  Patient identified by MRN, date of birth, ID band Patient awake    Reviewed: Allergy & Precautions, H&P , NPO status , Patient's Chart, lab work & pertinent test results  Airway Mallampati: I TM Distance: >3 FB Neck ROM: Full    Dental  (+) Teeth Intact and Dental Advisory Given   Pulmonary asthma , COPD COPD inhaler, Recent URI , Resolved, Current Smoker,  breath sounds clear to auscultation        Cardiovascular Rhythm:Regular Rate:Normal     Neuro/Psych    GI/Hepatic GERD-  Medicated and Poorly Controlled,  Endo/Other  Morbid obesity  Renal/GU      Musculoskeletal   Abdominal   Peds  Hematology   Anesthesia Other Findings   Reproductive/Obstetrics                         Anesthesia Physical Anesthesia Plan  ASA: II  Anesthesia Plan: General   Post-op Pain Management:    Induction: Intravenous  Airway Management Planned: Oral ETT  Additional Equipment:   Intra-op Plan:   Post-operative Plan: Extubation in OR  Informed Consent: I have reviewed the patients History and Physical, chart, labs and discussed the procedure including the risks, benefits and alternatives for the proposed anesthesia with the patient or authorized representative who has indicated his/her understanding and acceptance.   Dental advisory given  Plan Discussed with: CRNA, Anesthesiologist and Surgeon  Anesthesia Plan Comments:         Anesthesia Quick Evaluation

## 2012-04-08 NOTE — H&P (Signed)
Alicia Yang  #[id number]  DOB:  Oct 06, 1971  03/09/2012:  Alicia Yang returns today.  She was scheduled for anterior cervical decompression and fusion at C4-5 and C5-6 levels.  She cancelled the surgery because she said she was reluctant to go through with it.  She now says that her neck pain has increased.  Her left arm pain is worse.  She has tingling into both her hands and tingling in her right leg.  She said she fell over a buggy in a parking lot at grocery store about 7 weeks ago and has had severe pain since then.  She is currently taking Percocet 10/325 4 x daily and says it does not help her at night, Soma 350 mg 3 x daily, Neurontin 300 mg once daily.    Her examination today reveals persistent left deltoid and biceps weakness.  She also has a markedly positive Spurlings' maneuver to the left.    At this point, I have recommended pursuing surgery.  She has scheduled this for the 14th of June.  Risks and benefits were discussed in detail and she does wish to proceed.  I told her that if she cancelled the surgery again, I would discharge her from my care.          Danae Orleans. 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  She describes daily low back pain into her right leg and great toe.  She says this goes on for at least 9 days a month.  She says that she has weakness in her right leg which gives out.  She says this has gotten worse recently.  She says the pain is bad in the morning.  She has to roll out of bed.  She has also noted that her right eye is twitching, her left arm is twitching and she has poor sleep.  She has been engaged in physical therapy, water therapy.  She went to pain management 10 years ago.  She notes episodes of spasms in her shoulders  which cause "involuntary thrashing of the arms".  She says this goes on 2 to 3 x per day.  She says she lost weight years ago and the pain decreased, but lost her job and has no insurance.  She notes some pain in the right side of her head 2 to 3 days per week.    REVIEW OF SYSTEMS:   Review of Systems was reviewed with the patient.  Pertinent positives include wears glasses, ear pain, ringing in ears, balance disturbance, nasal congestion/drainage, chest pain/angina, leg pain while walking, asthma, chronic cough, shortness of breath, bronchitis, nausea, vomiting, ulcers or gastritis, abdominal pain, difficulty starting and stopping urinary stream, arm weakness, leg weakness, back pain, leg pain, arthritis, neck pain, problems with memory, anxiety, depression, allergic to hay and eggs.    PAST MEDICAL HISTORY:      Current Medical Conditions:  She has a history of asthma, COPD, GI disorder with precancerous polyps.  She has PTSD, bipolar borderline and depression.  As previously described.      Prior Operations and Hospitalizations:  She had upper and lower GI for polyps in 01/02/2010, tubal ligation in 1998, hysterectomy in 1999.      Medications and Allergies:  Xanax 1 mg qid, Albuterol 2 puffs qid, Seroquel qhs, Percocet tid, Singulair qd, and a medication for PTSD.  No known drug allergies.      Height and Weight:  She is 5', 5" tall and 198 lbs. with a BMI of 32.9.    FAMILY HISTORY:    Mother is 57.  Father is 23 with irregular heart rate and diabetes.    SOCIAL HISTORY:    She notes she uses marijuana.  She denies alcohol.  She is a one pack per day smoker of cigarettes.    DIAGNOSTIC STUDIES:   She had a head CT on 11/2009 which was normal.    Plain radiographs of the lumbar spine demonstrate spondylolisthesis of L5 on S1.  She has decrease of 15 mm. in extension and increases to 17 mm. on flexion and decreases to 9 mm. in neutral alignment.  She has not had a recent MRI of her lumbar spine.     PHYSICAL EXAMINATION:      General Appearance:  Alicia Yang is a pleasant, cooperative, somewhat anxious appearing woman in no acute distress.      Blood Pressure, Pulse and Respiratory Rate:  Blood pressure is 128/78.  Heart rate is 80 and regular.  Respiratory rate is 20.        HEENT - normocephalic, atraumatic.  The pupils are equal, round and reactive to light.  The extraocular muscles are intact.  Sclerae - white.  Conjunctiva - pink.  Oropharynx benign.  Uvula midline.     Neck - there are no masses, meningismus, deformities, tracheal deviation, jugular vein distention or carotid bruits.  There is normal cervical range of motion.  Spurlings' test is negative without reproducible radicular pain turning the patient's head to either side.  Lhermitte's sign is not present with axial compression.      Respiratory - there is normal respiratory effort with good intercostal function.  Lungs are clear to auscultation.  There are no rales, rhonchi or wheezes.      Cardiovascular - the heart has regular rate and rhythm to auscultation.  No murmurs are appreciated.  There is no extremity edema, cyanosis or clubbing.  There are palpable pedal pulses.      Abdomen - soft, nontender, no hepatosplenomegaly appreciated or masses.  There are active bowel sounds.  No guarding or rebound.      Musculoskeletal Examination - she complains of pain at the lumbosacral junction.  She has bilateral sciatic notch discomfort.  She is able to stand on her heels and touch her toes.  She has a normal casual gait.  Straight leg raise is positive at 45 degrees on the right.    NEUROLOGICAL EXAMINATION: The patient is oriented to time, person and place and has good recall of both recent and remote memory with normal attention span and concentration.     The patient speaks with clear and fluent speech and exhibits normal language function and appropriate fund of knowledge.      Cranial Nerve Examination - pupils are  equal, round and reactive to light.  Extraocular movements are full.  Visual fields are full to confrontational testing.  Facial sensation and facial movement are symmetric and intact.  Hearing is intact to finger rub.  Palate is upgoing.  Shoulder shrug is symmetric.  Tongue protrudes in the midline.      Motor Examination - motor strength is 5/5 in the bilateral deltoids, biceps, triceps, handgrips, wrist extensors, interosseous.  In the lower  extremities motor strength is 5/5 with the exception of right EHL strength at 4/5, right hip abductor strength at 4/5.      Deep Tendon Reflexes - 2 in the biceps, triceps and brachioradialis, 2 at the knees, 2 at the ankles and great toes are downgoing to plantar stimulation.      Cerebellar Examination - normal coordination in upper and lower extremities and normal rapid alternating movements.  Romberg test is negative.    IMPRESSION AND RECOMMENDATIONS:   Alicia Yang is a 41 year old woman with low back pain who is morbidly obese and a one pack per day smoker.  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  I told her that this is likely something that may require intervention, but that it only makes sense to do so if she stops smoking and also I told her the importance of not taking illicit drugs and that she work on weight control.  I have recommended that she get an MRI of her lumbar spine to see if there is significant nerve root compression.  I will make further recommendations after that has been done.    [facility]    Danae Orleans. Venetia Maxon, M.D.

## 2012-04-08 NOTE — Transfer of Care (Signed)
Immediate Anesthesia Transfer of Care Note  Patient: Alicia Yang  Procedure(s) Performed: Procedure(s) (LRB): ANTERIOR CERVICAL DECOMPRESSION/DISCECTOMY FUSION 2 LEVELS (Bilateral)  Patient Location: PACU  Anesthesia Type: General  Level of Consciousness: awake, alert , oriented and patient cooperative  Airway & Oxygen Therapy: Patient Spontanous Breathing and Patient connected to nasal cannula oxygen  Post-op Assessment: Report given to PACU RN, Post -op Vital signs reviewed and stable and Patient moving all extremities X 4  Post vital signs: Reviewed and stable  Complications: No apparent anesthesia complications

## 2012-04-09 MED ORDER — OXYCODONE-ACETAMINOPHEN 5-325 MG PO TABS: 1.0000 | ORAL_TABLET | ORAL | Status: AC | PRN

## 2012-04-09 MED ORDER — ALUM & MAG HYDROXIDE-SIMETH 200-200-20 MG/5ML PO SUSP
30.0000 mL | Freq: Four times a day (QID) | ORAL | Status: DC | PRN
  Administered 2012-04-09: 30 mL via ORAL
  Filled 2012-04-09: qty 30

## 2012-04-09 MED ORDER — DIAZEPAM 5 MG PO TABS: 5.0000 mg | ORAL_TABLET | Freq: Four times a day (QID) | ORAL | Status: AC | PRN

## 2012-04-09 NOTE — Discharge Summary (Signed)
  Physician Discharge Summary  Patient ID: Alicia Yang MRN: [id number] DOB/AGE: 03/07/1971 41 y.o.  Admit date: 04/08/2012 Discharge date: 04/09/2012  Admission Diagnoses: Cervical spondylosis with radiculopathy  Discharge Diagnoses: Same Active Problems:  * No active hospital problems. *    Discharged Condition: good  Hospital Course: Patient is admitted hospital underwent an ACDF postoperatively patient did very well went to the floor on the floor was convalescing well and living and voiding spontaneously tolerating a diet wound is clean and dry stable for discharge home posterior day 1  Consults: Significant Diagnostic Studies: Treatments: ACDF Discharge Exam: Blood pressure 130/77, pulse 85, temperature 97.6 F (36.4 C), temperature source Oral, resp. rate 18, SpO2 98.00%. Strength out of 5 wound clean and dry  Disposition: Home   Medication List  As of 04/09/2012  7:54 AM   TAKE these medications         albuterol 108 (90 BASE) MCG/ACT inhaler   Commonly known as: PROVENTIL HFA;VENTOLIN HFA   Inhale 2 puffs into the lungs every 6 (six) hours as needed.      ALPRAZolam 1 MG tablet   Commonly known as: XANAX   Take 1 mg by mouth 3 (three) times daily as needed. For anxiety      buPROPion 100 MG tablet   Commonly known as: WELLBUTRIN   Take 100 mg by mouth 2 (two) times daily.      calcium-vitamin D 500-200 MG-UNIT per tablet   Take 1 tablet by mouth 2 (two) times daily with a meal.      carisoprodol 350 MG tablet   Commonly known as: SOMA   Take 350 mg by mouth 3 (three) times daily as needed. For muscle spams      citalopram 40 MG tablet   Commonly known as: CELEXA   Take 40 mg by mouth daily.      diazepam 5 MG tablet   Commonly known as: VALIUM   Take 1 tablet (5 mg total) by mouth every 6 (six) hours as needed.      esomeprazole 40 MG capsule   Commonly known as: NEXIUM   Take 40 mg by mouth daily before breakfast.      montelukast 10 MG tablet     Commonly known as: SINGULAIR   Take 10 mg by mouth at bedtime.      oxyCODONE-acetaminophen 10-325 MG per tablet   Commonly known as: PERCOCET   Take 1 tablet by mouth every 6 (six) hours as needed. For pain      oxyCODONE-acetaminophen 5-325 MG per tablet   Commonly known as: PERCOCET   Take 1-2 tablets by mouth every 4 (four) hours as needed.      phentermine 37.5 MG tablet   Commonly known as: ADIPEX-P   Take 37.5 mg by mouth daily before breakfast.      ranitidine 150 MG tablet   Commonly known as: ZANTAC   Take 150 mg by mouth 2 (two) times daily.      vitamin B-12 100 MCG tablet   Commonly known as: CYANOCOBALAMIN   Inject 50 mcg as directed every 30 (thirty) days.             Signed: Parisa Pinela P 04/09/2012, 7:54 AM

## 2012-04-09 NOTE — Discharge Instructions (Signed)

## 2012-04-09 NOTE — Progress Notes (Signed)
Subjective: Patient reports She's feeling well no arm pain swelling difficulty and some neck stiffness but otherwise doing well  Objective: Vital signs in last 24 hours: Temp:  [97.3 F (36.3 C)-99.4 F (37.4 C)] 97.6 F (36.4 C) (06/15 0402) Pulse Rate:  [66-87] 85  (06/15 0402) Resp:  [16-26] 18  (06/15 0402) BP: (89-130)/(46-77) 130/77 mmHg (06/15 0402) SpO2:  [86 %-98 %] 98 % (06/15 0402)  Intake/Output from previous day: 06/14 0701 - 06/15 0700 In: 2680 [P.O.:480; I.V.:2200] Out: -  Intake/Output this shift:    strength 5 out of 5 wound clean and dry  Lab Results: No results found for this basename: WBC:2,HGB:2,HCT:2,PLT:2 in the last 72 hours BMET No results found for this basename: NA:2,K:2,CL:2,CO2:2,GLUCOSE:2,BUN:2,CREATININE:2,CALCIUM:2 in the last 72 hours  Studies/Results: Dg Cervical Spine 2-3 Views  04/08/2012  *RADIOLOGY REPORT*  Clinical Data: ACDF C4-C5, C5-C6.  CERVICAL SPINE - 2-3 VIEW  Comparison: 01/01/2012.  Findings: Exams are technically suboptimal due to patient body habitus.  Localization is performed at C4-C5 and C5-C6.  On the final image, I cannot visualize an ACDF plate.  Endotracheal tube is present on all three images.  IMPRESSION: Intraoperative localization at C4-C5 and C5-C6.  Plate not visualized.  Original Report Authenticated By: Andreas Newport, M.D.    Assessment/Plan: Posterior day 1 from cervical discharge him  LOS: 1 day     Saarah Dewing P 04/09/2012, 7:52 AM

## 2012-04-11 ENCOUNTER — Encounter (HOSPITAL_COMMUNITY): Payer: Self-pay | Admitting: Neurosurgery

## 2013-02-24 HISTORY — PX: COLONOSCOPY: SHX174

## 2015-12-27 HISTORY — PX: ESOPHAGOGASTRODUODENOSCOPY: SHX1529

## 2016-11-16 ENCOUNTER — Other Ambulatory Visit: Payer: Self-pay | Admitting: Orthopedic Surgery

## 2016-11-16 DIAGNOSIS — M25521 Pain in right elbow: Secondary | ICD-10-CM

## 2018-12-09 ENCOUNTER — Encounter: Payer: Self-pay | Admitting: Gastroenterology

## 2018-12-20 ENCOUNTER — Encounter: Payer: Self-pay | Admitting: Gastroenterology

## 2018-12-21 ENCOUNTER — Ambulatory Visit: Payer: Medicare Other | Admitting: Gastroenterology

## 2021-02-17 ENCOUNTER — Encounter: Attending: Family Medicine

## 2021-06-23 ENCOUNTER — Encounter: Attending: Family Medicine

## 2021-10-04 ENCOUNTER — Encounter: Payer: Self-pay | Admitting: Emergency Medicine

## 2021-10-04 ENCOUNTER — Other Ambulatory Visit: Payer: Self-pay

## 2021-10-04 ENCOUNTER — Ambulatory Visit (INDEPENDENT_AMBULATORY_CARE_PROVIDER_SITE_OTHER): Payer: Medicare Other

## 2021-10-04 ENCOUNTER — Ambulatory Visit
Admission: EM | Admit: 2021-10-04 | Discharge: 2021-10-04 | Disposition: A | Discharge status: Home / Self Care | Payer: Medicare Other | Attending: Internal Medicine | Admitting: Internal Medicine

## 2021-10-04 DIAGNOSIS — W19XXXA Unspecified fall, initial encounter: Secondary | ICD-10-CM

## 2021-10-04 DIAGNOSIS — M25571 Pain in right ankle and joints of right foot: Secondary | ICD-10-CM

## 2021-10-04 DIAGNOSIS — S93401A Sprain of unspecified ligament of right ankle, initial encounter: Secondary | ICD-10-CM

## 2021-10-04 NOTE — ED Provider Notes (Signed)
[facility]    CSN: [id number] Arrival date & time: 10/04/21  1334      History   Chief Complaint Chief Complaint  Patient presents with   Ankle Pain    HPI Alicia Yang is a 50 y.o. female.   Here today for evaluation of right ankle pain after she rolled her ankle going down her steps.  She reports that she had immediate pain.  She has had some trouble bearing weight on her ankle due to pain.  She has not had any numbness or tingling. She has tried multiple medications including gabapentin with mild relief.   The history is provided by the patient.  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   Past Surgical History:  Procedure Laterality Date   ABDOMINAL HYSTERECTOMY  2001   ANTERIOR CERVICAL DECOMP/DISCECTOMY FUSION  04/08/2012   Procedure: ANTERIOR CERVICAL DECOMPRESSION/DISCECTOMY FUSION 2 LEVELS;  Surgeon: Maeola Harman, MD;  Location: [facility];  Service: Neurosurgery;  Laterality: Bilateral;  Cervical four-five, five-six  Anterior cervical decompression/diskectomy, fusion   COLONOSCOPY  02/24/2013   Diminutive colonic polyps, status post polypectomy. Small internal hemorrhoids. Otherwise normal colonoscopy. The colon was redundant.    ESOPHAGOGASTRODUODENOSCOPY  12/27/2015   Minimal transient hiatal hernia. Mild gastritis. Status post esophageal dilatation.    TUBAL LIGATION  1998    OB History   No obstetric history on file.      Home Medications    Prior to Admission medications   Medication Sig Start Date End Date Taking? Authorizing Provider  albuterol (PROVENTIL  HFA;VENTOLIN HFA) 108 (90 BASE) MCG/ACT inhaler Inhale 2 puffs into the lungs every 6 (six) hours as needed.   Yes [provider]  buPROPion (WELLBUTRIN) 100 MG tablet Take 100 mg by mouth 2 (two) times daily.   Yes [provider]  esomeprazole (NEXIUM) 40 MG capsule Take 40 mg by mouth daily before breakfast.   Yes [provider]  ALPRAZolam (XANAX) 1 MG tablet Take 1 mg by mouth 3 (three) times daily as needed. For anxiety    [provider]  Calcium Carbonate-Vitamin D (CALCIUM-VITAMIN D) 500-200 MG-UNIT per tablet Take 1 tablet by mouth 2 (two) times daily with a meal.    [provider]  carisoprodol (SOMA) 350 MG tablet Take 350 mg by mouth 3 (three) times daily as needed. For muscle spams    [provider]  citalopram (CELEXA) 40 MG tablet Take 40 mg by mouth daily.    [provider]  montelukast (SINGULAIR) 10 MG tablet Take 10 mg by mouth at bedtime.    [provider]  oxyCODONE-acetaminophen (PERCOCET) 10-325 MG per tablet Take 1 tablet by mouth every 6 (six) hours as needed. For pain    [provider]  phentermine (ADIPEX-P) 37.5 MG tablet Take 37.5 mg by mouth daily before breakfast.    [provider]  ranitidine (ZANTAC) 150 MG tablet Take 150 mg by mouth 2 (two) times daily.    [provider]  vitamin B-12 (CYANOCOBALAMIN) 100 MCG tablet Inject 50 mcg  as directed every 30 (thirty) days.    [provider]    Family History History reviewed. No pertinent family history.  Social History Social History   Tobacco Use   Smoking status: Every Day    Types: Cigarettes  Substance Use Topics   Alcohol use: Yes    Alcohol/week: 1.0 standard drink    Types: 1 Glasses of wine per week     Allergies   Eggs or egg-derived products and Latex   Review of Systems Review of Systems  Constitutional:  Negative for chills and fever.  Eyes:  Negative for discharge and  redness.  Respiratory:  Negative for shortness of breath.   Gastrointestinal:  Negative for abdominal pain, nausea and vomiting.  Genitourinary:  Positive for vaginal bleeding and vaginal discharge.  Musculoskeletal:  Positive for arthralgias and joint swelling.  Neurological:  Negative for numbness.    Physical Exam Triage Vital Signs ED Triage Vitals [10/04/21 1455]  Enc Vitals Group     BP 127/75     Pulse Rate 67     Resp      Temp 98.5 F (36.9 C)     Temp Source Oral     SpO2 97 %     Weight 267 lb (121.1 kg)     Height 5\' 3"  (1.6 m)     Head Circumference      Peak Flow      Pain Score 5     Pain Loc      Pain Edu?      Excl. in GC?    No data found.  Updated Vital Signs BP 127/75 (BP Location: Left Arm)   Pulse 67   Temp 98.5 F (36.9 C) (Oral)   Ht 5\' 3"  (1.6 m)   Wt 267 lb (121.1 kg)   SpO2 97%   BMI 47.30 kg/m      Physical Exam Vitals and nursing note reviewed.  Constitutional:      General: She is not in acute distress.    Appearance: Normal appearance. She is not ill-appearing.  HENT:     Head: Normocephalic and atraumatic.  Eyes:     Conjunctiva/sclera: Conjunctivae normal.  Cardiovascular:     Rate and Rhythm: Normal rate.  Pulmonary:     Effort: Pulmonary effort is normal.  Musculoskeletal:     Comments: Mild diffuse swelling to lateral malleolus of right ankle. TTP noted to posterior joint line of same. Decreased ROM of ankle due to pain  Neurological:     Mental Status: She is alert.     Comments: Gross sensation of right toes intact  Psychiatric:        Mood and Affect: Mood normal.        Behavior: Behavior normal.        Thought Content: Thought content normal.     UC Treatments / Results  Labs (all labs ordered are listed, but only abnormal results are displayed) Labs Reviewed - No data to display  EKG   Radiology DG Ankle Complete Right  Result Date: 10/04/2021 CLINICAL DATA:  Fall EXAM: RIGHT ANKLE - COMPLETE 3+  VIEW COMPARISON:  None. FINDINGS: There is no evidence of fracture, dislocation, or joint effusion. There is no evidence of arthropathy or other focal bone abnormality. Soft tissue swelling of the lateral ankle. IMPRESSION: Soft tissue swelling of the lateral ankle. No acute osseous abnormality. Electronically Signed   By: M.D.   On: 10/04/2021 14:51  Procedures Procedures (including critical care time)  Medications Ordered in UC Medications - No data to display  Initial Impression / Assessment and Plan / UC Course  I have reviewed the triage vital signs and the nursing notes.  Pertinent labs & imaging results that were available during my care of the patient were reviewed by me and considered in my medical decision making (see chart for details).   Xray without fracture. Will wrap with ACE bandage in office and crutches provided. Did discuss that she could bear weight, crutches for comfort at this time. Recommended follow up with ortho if no gradual improvement with RICE therapy.   Final Clinical Impressions(s) / UC Diagnoses   Final diagnoses:  Sprain of right ankle, unspecified ligament, initial encounter   Discharge Instructions   None    ED Prescriptions   None    PDMP not reviewed this encounter.   Tomi Bamberger, PA-C 10/05/21 [phone]

## 2021-10-04 NOTE — ED Triage Notes (Signed)
Patient states she fell coming down the stairs today and rolled her right ankle.  Patient is having ankle swelling.  Patient has taken Ibuprofen, Tylenol and Gabapentin.

## 2023-01-26 ENCOUNTER — Encounter: Payer: Self-pay | Admitting: Gastroenterology

## 2023-07-23 IMAGING — DX DG ANKLE COMPLETE 3+V*R*
3 series · 3 of 3 positions shown · non-contrast
Comparison: None.

CLINICAL DATA: Fall

EXAM:
RIGHT ANKLE - COMPLETE 3+ VIEW

[ankle ap]
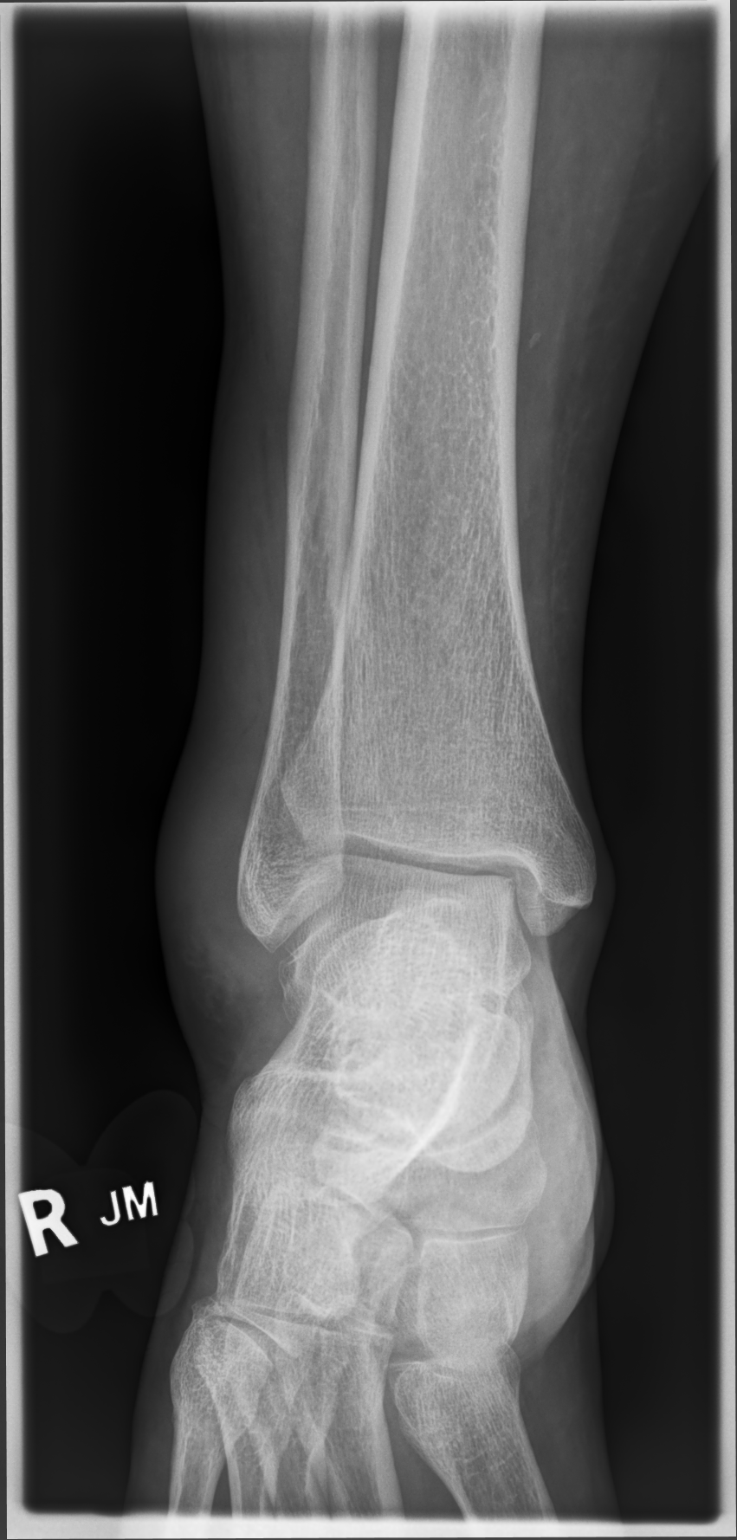

[ankle medial oblique]
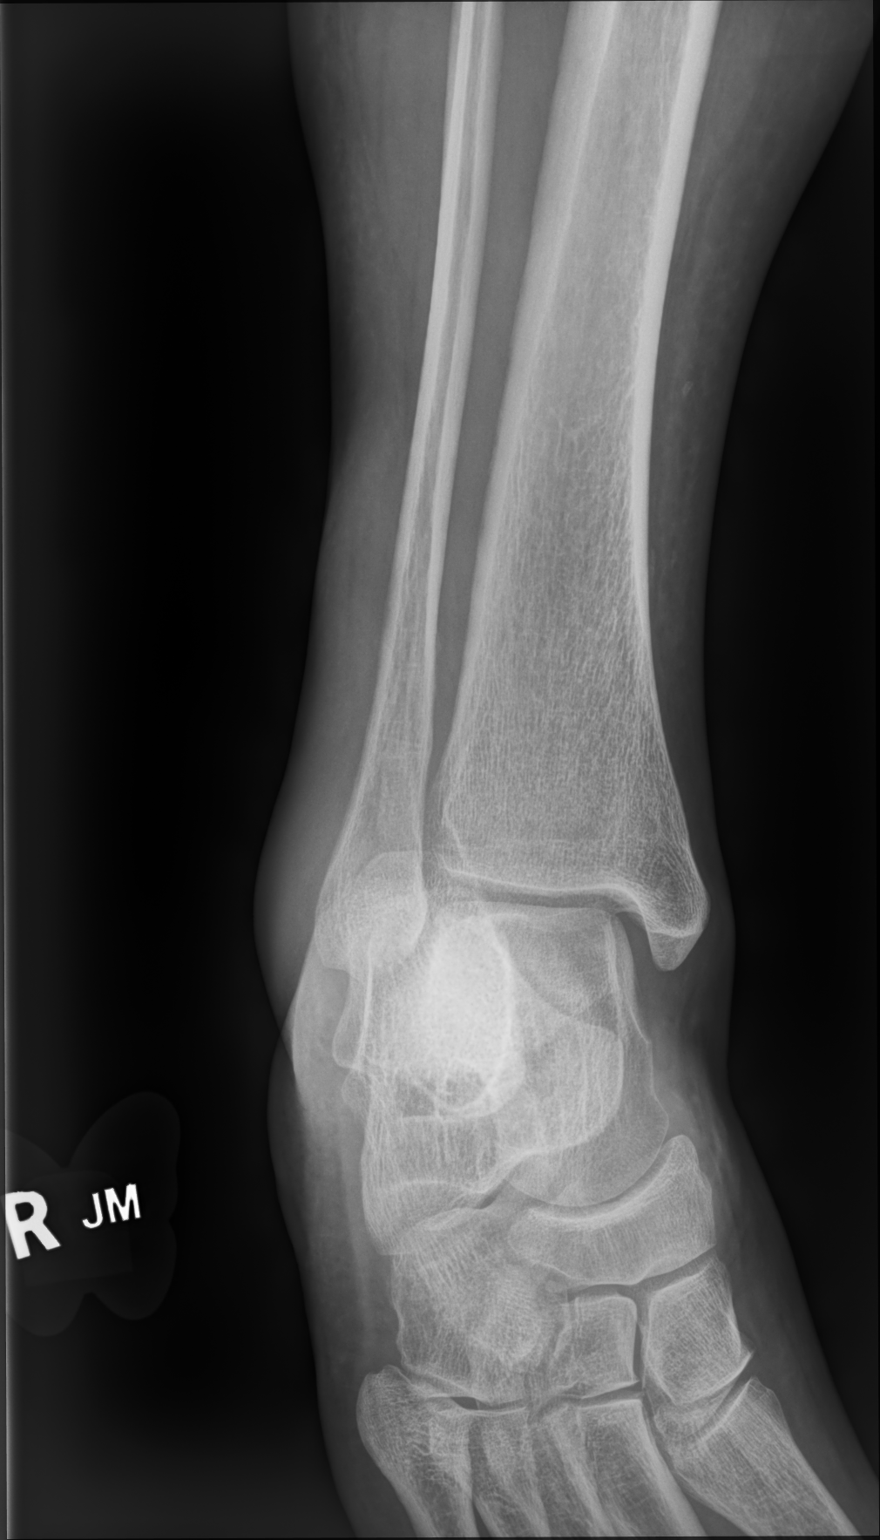

[ankle lat]
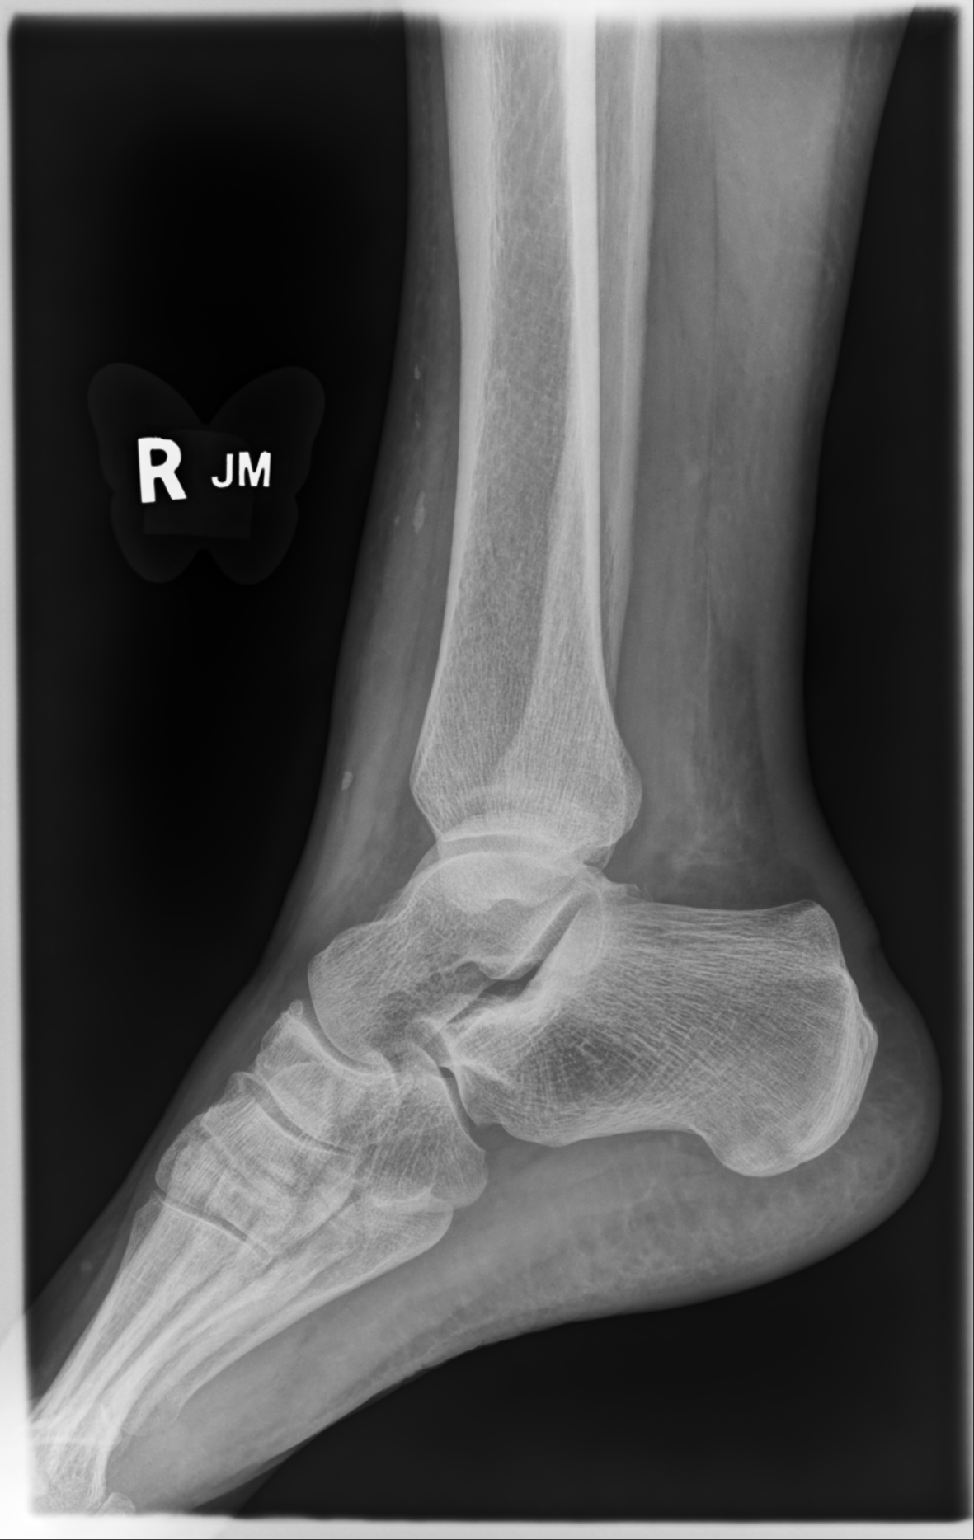

[3 of 3 positions shown; findings below may reference images not displayed]

FINDINGS: There is no evidence of fracture, dislocation, or joint effusion.
There is no evidence of arthropathy or other focal bone abnormality.
Soft tissue swelling of the lateral ankle.
IMPRESSION: Soft tissue swelling of the lateral ankle. No acute osseous
abnormality.
